# Patient Record
Sex: Female | Born: 2000 | Race: White | Hispanic: No | Marital: Single | Smoking: Never smoker
Health system: Southern US, Community
[De-identification: ages and names within clinical notes are randomized; demographics above are authoritative.]

## PROBLEM LIST (undated history)

## (undated) DIAGNOSIS — F419 Anxiety disorder, unspecified: Secondary | ICD-10-CM

## (undated) DIAGNOSIS — L409 Psoriasis, unspecified: Secondary | ICD-10-CM

---
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
## 2022-10-10 ENCOUNTER — Ambulatory Visit (INDEPENDENT_AMBULATORY_CARE_PROVIDER_SITE_OTHER): Payer: Medicaid Other

## 2022-10-10 ENCOUNTER — Ambulatory Visit
Admission: EM | Admit: 2022-10-10 | Discharge: 2022-10-10 | Disposition: A | Discharge status: Home / Self Care | Payer: Medicaid Other | Attending: Family Medicine | Admitting: Family Medicine

## 2022-10-10 ENCOUNTER — Encounter: Payer: Self-pay | Admitting: Emergency Medicine

## 2022-10-10 DIAGNOSIS — M25521 Pain in right elbow: Secondary | ICD-10-CM | POA: Diagnosis not present

## 2022-10-10 DIAGNOSIS — S52124A Nondisplaced fracture of head of right radius, initial encounter for closed fracture: Secondary | ICD-10-CM | POA: Diagnosis not present

## 2022-10-10 DIAGNOSIS — W19XXXA Unspecified fall, initial encounter: Secondary | ICD-10-CM | POA: Diagnosis not present
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
## 2022-10-10 NOTE — Discharge Instructions (Signed)
Use ice to reduce pain and swelling Keep elbow immobilized Take Tylenol or ibuprofen for pain Call sports medicine Monday morning to schedule follow-up for next week

## 2022-10-10 NOTE — ED Provider Notes (Signed)
[name]    CSN: [id number] Arrival date & time: 10/10/22  1521      History   Chief Complaint Chief Complaint  Patient presents with   Arm Injury    HPI Sharon Ford is a 21 y.o. female.   HPI   Fell on outstretched hand.  Pain in elbow.  Limited movement.  Feels swollen History reviewed. No pertinent past medical history.  There are no problems to display for this patient.   History reviewed. No pertinent surgical history.  OB History   No obstetric history on file.      Home Medications    Prior to Admission medications   Medication Sig Start Date End Date Taking? Authorizing Provider  JUNEL FE 1/20 1-20 MG-MCG tablet Take 1 tablet by mouth daily. 09/10/22  Yes [provider]  SKYRIZI PEN 150 MG/ML SOAJ Inject into the skin. 08/27/22  Yes [provider]    Family History History reviewed. No pertinent family history.  Social History Social History   Tobacco Use   Smoking status: Never   Smokeless tobacco: Current  Vaping Use   Vaping Use: Every day  Substance Use Topics   Alcohol use: Never   Drug use: Never     Allergies   Patient has no known allergies.   Review of Systems Review of Systems See HPI  Physical Exam Triage Vital Signs ED Triage Vitals  Enc Vitals Group     BP 10/10/22 1531 (!) 142/78     Pulse Rate 10/10/22 1531 61     Resp 10/10/22 1531 18     Temp 10/10/22 1531 98.9 F (37.2 C)     Temp Source 10/10/22 1531 Oral     SpO2 10/10/22 1531 99 %     Weight 10/10/22 1532 175 lb (79.4 kg)     Height 10/10/22 1532 5\' 10"  (1.778 m)     Head Circumference --      Peak Flow --      Pain Score 10/10/22 1532 6     Pain Loc --      Pain Edu? --      Excl. in GC? --    No data found.  Updated Vital Signs BP (!) 142/78 (BP Location: Left Arm)   Pulse 61   Temp 98.9 F (37.2 C) (Oral)   Resp 18   Ht 5\' 10"  (1.778 m)   Wt 79.4 kg   LMP 09/23/2022 (Exact Date)   SpO2 99%   BMI  25.11 kg/m      Physical Exam Constitutional:      General: She is not in acute distress.    Appearance: She is well-developed.  HENT:     Head: Normocephalic and atraumatic.  Eyes:     Conjunctiva/sclera: Conjunctivae normal.     Pupils: Pupils are equal, round, and reactive to light.  Cardiovascular:     Rate and Rhythm: Normal rate.  Pulmonary:     Effort: Pulmonary effort is normal. No respiratory distress.  Abdominal:     General: There is no distension.     Palpations: Abdomen is soft.  Musculoskeletal:        General: Swelling and tenderness present. Normal range of motion.     Cervical back: Normal range of motion.     Comments: Pain with full extension.  Pain with pronation supination.  Tender proximal radius.  No tenderness to olecranon process  Skin:    General: Skin is warm and  dry.  Neurological:     Mental Status: She is alert.  Psychiatric:        Mood and Affect: Mood normal.        Behavior: Behavior normal.      UC Treatments / Results  Labs (all labs ordered are listed, but only abnormal results are displayed) Labs Reviewed - No data to display  EKG   Radiology DG Elbow Complete Right  Result Date: 10/10/2022 CLINICAL DATA:  Fall with elbow pain. EXAM: RIGHT ELBOW - COMPLETE 3+ VIEW COMPARISON:  None Available. FINDINGS: There is an elbow joint effusion. There is suggestion of a small fracture of the radial head on a single view. There is no evidence of arthropathy or other focal bone abnormality. Soft tissues are unremarkable. IMPRESSION: Elbow joint effusion with suggestion of a small fracture of the radial head on a single view. Electronically Signed   By: Zerita Boers M.D.   On: 10/10/2022 15:59    Procedures Procedures (including critical care time)  Medications Ordered in UC Medications - No data to display  Initial Impression / Assessment and Plan / UC Course  I have reviewed the triage vital signs and the nursing notes.  Pertinent  labs & imaging results that were available during my care of the patient were reviewed by me and considered in my medical decision making (see chart for details).     Final Clinical Impressions(s) / UC Diagnoses   Final diagnoses:  Closed nondisplaced fracture of head of right radius, initial encounter  Fall, initial encounter     Discharge Instructions      Use ice to reduce pain and swelling Keep elbow immobilized Take Tylenol or ibuprofen for pain Call sports medicine Monday morning to schedule follow-up for next week   ED Prescriptions   None    PDMP not reviewed this encounter.   Raylene Everts, MD 10/10/22 [phone]

## 2024-12-24 ENCOUNTER — Ambulatory Visit

## 2024-12-24 ENCOUNTER — Ambulatory Visit: Admission: RE | Admit: 2024-12-24 | Discharge: 2024-12-24 | Disposition: A | Discharge status: Home / Self Care

## 2024-12-24 VITALS — BP 137/85 | HR 87 | Temp 98.3°F | Resp 18

## 2024-12-24 DIAGNOSIS — R051 Acute cough: Secondary | ICD-10-CM

## 2024-12-24 DIAGNOSIS — R0602 Shortness of breath: Secondary | ICD-10-CM

## 2024-12-24 DIAGNOSIS — R062 Wheezing: Secondary | ICD-10-CM

## 2024-12-24 HISTORY — DX: Anxiety disorder, unspecified: F41.9

## 2024-12-24 HISTORY — DX: Psoriasis, unspecified: L40.9

## 2024-12-24 MED ORDER — ALBUTEROL SULFATE HFA 108 (90 BASE) MCG/ACT IN AERS: 1.0000 | INHALATION_SPRAY | Freq: Four times a day (QID) | RESPIRATORY_TRACT | 0 refills | Status: AC | PRN

## 2024-12-24 MED ORDER — PREDNISONE 20 MG PO TABS: 40.0000 mg | ORAL_TABLET | Freq: Every day | ORAL | 0 refills | Status: AC

## 2024-12-24 NOTE — Discharge Instructions (Signed)
 Chest x-ray is pending. I will call ONLY if it is abnormal. I have prescribed an albuterol  inhaler and prednisone  steroid to help combat symptoms. Please follow up if symptoms persist or worsen.

## 2024-12-24 NOTE — ED Triage Notes (Signed)
 Pt c/o cough and shortness of breath which started  Thursday when she was cleaning with bleach. Using chlorasept spray and cough drops

## 2024-12-24 NOTE — ED Provider Notes (Signed)
 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 - Entered by patient   Shortness of Breath   Cough    HPI Sharon Ford is a 24 y.o. female.   Patient presents with cough and shortness of breath that started about 4 days ago. She states that it started when she was cleaning a bathroom while using bleach. 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 She has been using cough drops for symptoms. Denies history of asthma. Shortness of breath is intermittent.    Allergic Reaction Shortness of Breath Cough   Past Medical History:  Diagnosis Date   Anxiety    Psoriasis     There are no active problems to display for this patient.   History reviewed. No pertinent surgical history.  OB History   No obstetric history on file.      Home Medications    Prior to Admission medications  Medication Sig Start Date End Date Taking? Authorizing Provider  albuterol  (VENTOLIN  HFA) 108 (90 Base) MCG/ACT inhaler Inhale 1-2 puffs into the lungs every 6 (six) hours as needed for wheezing or shortness of breath. 12/24/24  Yes Shakti Fleer, Darryle E, FNP  escitalopram (LEXAPRO) 10 MG tablet Take 10 mg by mouth daily.   Yes [provider]  predniSONE  (DELTASONE ) 20 MG tablet Take 2 tablets (40 mg total) by mouth daily for 5 days. 12/24/24 12/29/24 Yes Wille Aubuchon, Darryle BRAVO, FNP  SKYRIZI PEN 150 MG/ML SOAJ Inject into the skin. 08/27/22  Yes [provider]  JUNEL FE 1/20 1-20 MG-MCG tablet Take 1 tablet by mouth daily. 09/10/22   [provider]    Family History History reviewed. No pertinent family history.  Social History Social History[1]   Allergies   Patient has no known  allergies.   Review of Systems Review of Systems Per HPI  Physical Exam Triage Vital Signs ED Triage Vitals  Encounter Vitals Group     BP 12/24/24 1333 137/85     Girls Systolic BP Percentile --      Girls Diastolic BP Percentile --      Boys Systolic BP Percentile --      Boys Diastolic BP Percentile --      Pulse Rate 12/24/24 1333 87     Resp 12/24/24 1333 18     Temp 12/24/24 1333 98.3 F (36.8 C)     Temp Source 12/24/24 1333 Oral     SpO2 12/24/24 1333 97 %     Weight --      Height --      Head Circumference --      Peak Flow --      Pain Score 12/24/24 1331 3     Pain Loc --      Pain Education --      Exclude from Growth Chart --    No data found.  Updated Vital Signs BP 137/85 (BP Location: Right Arm)   Pulse 87   Temp 98.3 F (36.8 C) (Oral)   Resp 18   LMP 12/08/2024 (Exact Date)   SpO2 97%   Visual Acuity Right Eye Distance:  Left Eye Distance:   Bilateral Distance:    Right Eye Near:   Left Eye Near:    Bilateral Near:     Physical Exam Constitutional:      General: She is not in acute distress.    Appearance: Normal appearance. She is not toxic-appearing or diaphoretic.     Comments: She is sitting upright in chair in no acute distress.   HENT:     Head: Normocephalic and atraumatic.     Mouth/Throat:     Mouth: Mucous membranes are moist.     Pharynx: No pharyngeal swelling or posterior oropharyngeal erythema.  Eyes:     Extraocular Movements: Extraocular movements intact.     Conjunctiva/sclera: Conjunctivae normal.  Cardiovascular:     Rate and Rhythm: Normal rate and regular rhythm.     Pulses: Normal pulses.     Heart sounds: Normal heart sounds.  Pulmonary:     Effort: Pulmonary effort is normal.     Breath sounds: Wheezing present.     Comments: Very mild expiratory wheezing in right lower lobe.  Neurological:     General: No focal deficit present.     Mental Status: She is alert and oriented to person, place, and time.  Mental status is at baseline.  Psychiatric:        Mood and Affect: Mood normal.        Behavior: Behavior normal.        Thought Content: Thought content normal.        Judgment: Judgment normal.      UC Treatments / Results  Labs (all labs ordered are listed, but only abnormal results are displayed) Labs Reviewed - No data to display  EKG   Radiology DG Chest 2 View Result Date: 12/24/2024 CLINICAL DATA:  Shortness of breath. EXAM: CHEST - 2 VIEW COMPARISON:  Chest radiograph dated 08/28/2020. FINDINGS: The heart size and mediastinal contours are within normal limits. Both lungs are clear. The visualized skeletal structures are unremarkable. IMPRESSION: No active cardiopulmonary disease. Electronically Signed   By: Vanetta Chou M.D.   On: 12/24/2024 14:39    Procedures Procedures (including critical care time)  Medications Ordered in UC Medications - No data to display  Initial Impression / Assessment and Plan / UC Course  I have reviewed the triage vital signs and the nursing notes.  Pertinent labs & imaging results that were available during my care of the patient were reviewed by me and considered in my medical decision making (see chart for details).     Patient presents with concerns of reaction to bleach. Called poison control who suggested chest x-ray and symptom management. Chest x-ray completed that was negative for any acute cardiopulmonary process. Patient does have mild expiratory wheezing on exam but there is no sign of respiratory distress, no airway compromise, no anaphylaxis, and oxygen is normal. She is sitting upright in chair in no acute distress, therefore do not think that ER evaluation is necessary. Suspect bleach caused irritant effect to lungs.  Will treat with albuterol  and prednisone . She was advised to avoid bleach products. Advised strict follow up precautions. Patient verbalized understanding and was agreeable with plan.  Final Clinical  Impressions(s) / UC Diagnoses   Final diagnoses:  Shortness of breath  Wheezing  Acute cough     Discharge Instructions      Chest x-ray is pending. I will call ONLY if it is abnormal. I have prescribed an albuterol  inhaler and prednisone  steroid to  help combat symptoms. Please follow up if symptoms persist or worsen.     ED Prescriptions     Medication Sig Dispense Auth. Provider   albuterol  (VENTOLIN  HFA) 108 (90 Base) MCG/ACT inhaler Inhale 1-2 puffs into the lungs every 6 (six) hours as needed for wheezing or shortness of breath. 1 each [place], Morrison E, [place]   predniSONE  (DELTASONE ) 20 MG tablet Take 2 tablets (40 mg total) by mouth daily for 5 days. 10 tablet Tarisha Fader E, [place]      PDMP not reviewed this encounter.     [1]  Social History Tobacco Use   Smoking status: Never   Smokeless tobacco: Current  Vaping Use   Vaping status: Every Day  Substance Use Topics   Alcohol use: Never   Drug use: Never     Hazen Darryle BRAVO, [place] 12/24/24 1453  "
